# Patient Record
Sex: Male | Born: 1935 | Race: White | Hispanic: No | Marital: Single
Health system: Southern US, Community
[De-identification: ages and names within clinical notes are randomized; demographics above are authoritative.]

---

## 2012-10-19 ENCOUNTER — Inpatient Hospital Stay: Payer: Self-pay | Admitting: Internal Medicine

## 2012-10-19 LAB — URINALYSIS, COMPLETE
Blood: NEGATIVE
Glucose,UR: 50 mg/dL (ref 0–75)
Hyaline Cast: 3
Nitrite: NEGATIVE
Ph: 5 (ref 4.5–8.0)
Protein: NEGATIVE
RBC,UR: 1 /HPF (ref 0–5)
Specific Gravity: 1.02 (ref 1.003–1.030)
Squamous Epithelial: <1

## 2012-10-19 LAB — IRON AND TIBC
Iron Saturation: 6 %
Iron: 16 ug/dL — ABNORMAL LOW (ref 65–175)
Unbound Iron-Bind.Cap.: 240 ug/dL

## 2012-10-19 LAB — COMPREHENSIVE METABOLIC PANEL
Albumin: 2.5 g/dL — ABNORMAL LOW (ref 3.4–5.0)
Alkaline Phosphatase: 81 U/L (ref 50–136)
Anion Gap: 16 (ref 7–16)
BUN: 42 mg/dL — ABNORMAL HIGH (ref 7–18)
Bilirubin,Total: 1.1 mg/dL — ABNORMAL HIGH (ref 0.2–1.0)
Calcium, Total: 8.2 mg/dL — ABNORMAL LOW (ref 8.5–10.1)
Co2: 18 mmol/L — ABNORMAL LOW (ref 21–32)
Creatinine: 1.15 mg/dL (ref 0.60–1.30)
EGFR (African American): >60
EGFR (Non-African Amer.): >60
Glucose: 141 mg/dL — ABNORMAL HIGH (ref 65–99)
Osmolality: 275 (ref 275–301)
Potassium: 3.6 mmol/L (ref 3.5–5.1)
SGPT (ALT): 9 U/L — ABNORMAL LOW (ref 12–78)
Sodium: 131 mmol/L — ABNORMAL LOW (ref 136–145)

## 2012-10-19 LAB — CBC
HGB: 6.4 g/dL — ABNORMAL LOW (ref 13.0–18.0)
MCH: 28.1 pg (ref 26.0–34.0)
MCHC: 32.8 g/dL (ref 32.0–36.0)
MCV: 86 fL (ref 80–100)
RBC: 2.27 10*6/uL — ABNORMAL LOW (ref 4.40–5.90)

## 2012-10-20 LAB — CBC WITH DIFFERENTIAL/PLATELET
Basophil #: 0 10*3/uL (ref 0.0–0.1)
Basophil %: 0.1 %
Eosinophil #: 0 10*3/uL (ref 0.0–0.7)
HGB: 7.3 g/dL — ABNORMAL LOW (ref 13.0–18.0)
Lymphocyte %: 7.9 %
MCHC: 32.5 g/dL (ref 32.0–36.0)
MCV: 86 fL (ref 80–100)
Neutrophil %: 86.6 %
Platelet: 328 10*3/uL (ref 150–440)
RDW: 15.2 % — ABNORMAL HIGH (ref 11.5–14.5)

## 2012-10-20 LAB — COMPREHENSIVE METABOLIC PANEL
Albumin: 1.9 g/dL — ABNORMAL LOW (ref 3.4–5.0)
Alkaline Phosphatase: 75 U/L (ref 50–136)
Anion Gap: 7 (ref 7–16)
Bilirubin,Total: 1.4 mg/dL — ABNORMAL HIGH (ref 0.2–1.0)
Calcium, Total: 7.4 mg/dL — ABNORMAL LOW (ref 8.5–10.1)
Co2: 23 mmol/L (ref 21–32)
Creatinine: 0.59 mg/dL — ABNORMAL LOW (ref 0.60–1.30)
Glucose: 88 mg/dL (ref 65–99)
Osmolality: 268 (ref 275–301)
Potassium: 3.8 mmol/L (ref 3.5–5.1)
Sodium: 132 mmol/L — ABNORMAL LOW (ref 136–145)
Total Protein: 5.3 g/dL — ABNORMAL LOW (ref 6.4–8.2)

## 2012-10-20 LAB — OCCULT BLOOD X 1 CARD TO LAB, STOOL: Occult Blood, Feces: POSITIVE

## 2012-10-21 LAB — CBC WITH DIFFERENTIAL/PLATELET
Eosinophil #: 0 10*3/uL (ref 0.0–0.7)
HCT: 27.3 % — ABNORMAL LOW (ref 40.0–52.0)
Lymphocyte #: 1.1 10*3/uL (ref 1.0–3.6)
MCH: 29.2 pg (ref 26.0–34.0)
MCHC: 33.2 g/dL (ref 32.0–36.0)
MCV: 88 fL (ref 80–100)
Monocyte %: 5.8 %
Neutrophil #: 8.9 10*3/uL — ABNORMAL HIGH (ref 1.4–6.5)
Neutrophil %: 83.5 %
RDW: 14.9 % — ABNORMAL HIGH (ref 11.5–14.5)

## 2012-10-22 LAB — AMMONIA: Ammonia, Plasma: <25 mcmol/L (ref 11–32)

## 2012-10-23 LAB — CBC WITH DIFFERENTIAL/PLATELET
Basophil #: 0 10*3/uL (ref 0.0–0.1)
Eosinophil #: 0.1 10*3/uL (ref 0.0–0.7)
HCT: 29 % — ABNORMAL LOW (ref 40.0–52.0)
HGB: 9.6 g/dL — ABNORMAL LOW (ref 13.0–18.0)
Lymphocyte #: 1.5 10*3/uL (ref 1.0–3.6)
Lymphocyte %: 18.2 %
MCH: 29 pg (ref 26.0–34.0)
MCV: 87 fL (ref 80–100)
Monocyte #: 0.6 x10 3/mm (ref 0.2–1.0)
Neutrophil #: 6 10*3/uL (ref 1.4–6.5)
Neutrophil %: 72.9 %
Platelet: 337 10*3/uL (ref 150–440)
RBC: 3.32 10*6/uL — ABNORMAL LOW (ref 4.40–5.90)
RDW: 15.4 % — ABNORMAL HIGH (ref 11.5–14.5)
WBC: 8.2 10*3/uL (ref 3.8–10.6)

## 2012-10-23 LAB — BASIC METABOLIC PANEL
Anion Gap: 7 (ref 7–16)
Calcium, Total: 7.7 mg/dL — ABNORMAL LOW (ref 8.5–10.1)
Chloride: 104 mmol/L (ref 98–107)
Co2: 27 mmol/L (ref 21–32)
Creatinine: 0.54 mg/dL — ABNORMAL LOW (ref 0.60–1.30)
EGFR (African American): >60
EGFR (Non-African Amer.): >60
Potassium: 3.9 mmol/L (ref 3.5–5.1)

## 2012-10-24 LAB — CBC WITH DIFFERENTIAL/PLATELET
Basophil %: 0.7 %
Eosinophil #: 0.1 10*3/uL (ref 0.0–0.7)
HCT: 26.9 % — ABNORMAL LOW (ref 40.0–52.0)
Lymphocyte %: 22.7 %
MCH: 29.5 pg (ref 26.0–34.0)
MCHC: 33.9 g/dL (ref 32.0–36.0)
MCV: 87 fL (ref 80–100)
Monocyte #: 0.5 x10 3/mm (ref 0.2–1.0)
Monocyte %: 8.3 %
Neutrophil #: 4.1 10*3/uL (ref 1.4–6.5)
Neutrophil %: 66 %
Platelet: 308 10*3/uL (ref 150–440)
RBC: 3.08 10*6/uL — ABNORMAL LOW (ref 4.40–5.90)

## 2013-01-17 DEATH — deceased

## 2013-06-10 IMAGING — CT CT ABDOMEN W/ CM
2 of 3 series · 13 of 32 positions shown, 19 images · IV contrast (isovue)
Comparison: None

REASON FOR EXAM: weight loss, malignant looking ulcer;    NOTE: Nursing
to Give Oral CT Contrast
COMMENTS:

PROCEDURE:     CT  - CT ABDOMEN STANDARD W  - October 24, 2012  [DATE]
RESULT:     History: Weight loss
TECHNIQUE: Multiple axial images of the abdomen were performed from the lung
bases to the iliac crests, with p.o. contrast and with 75 ml of Isovue 300
intravenous contrast.

[Series 2: 3mm soft tissue · axial · 0.68mm/px · z∈[-788,-548]mm · 11 of 97 slices shown, 17 images]
[im 9/97  soft-tissue]
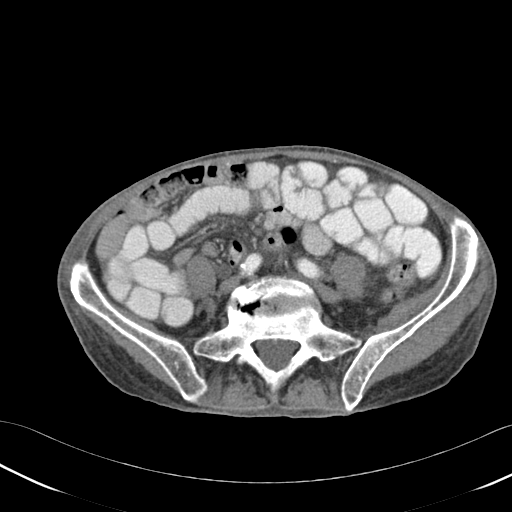
[im 9/97  bone]
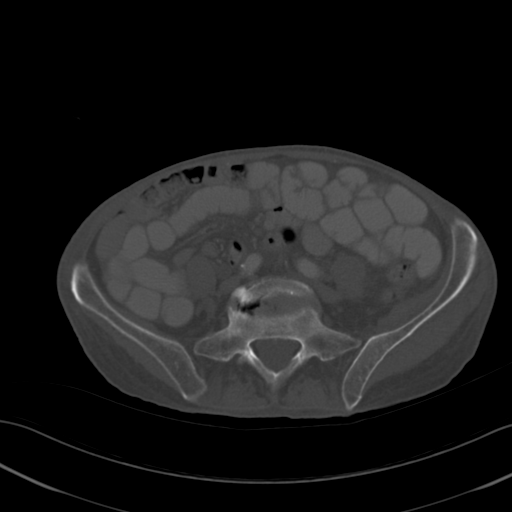
[im 17/97  soft-tissue]
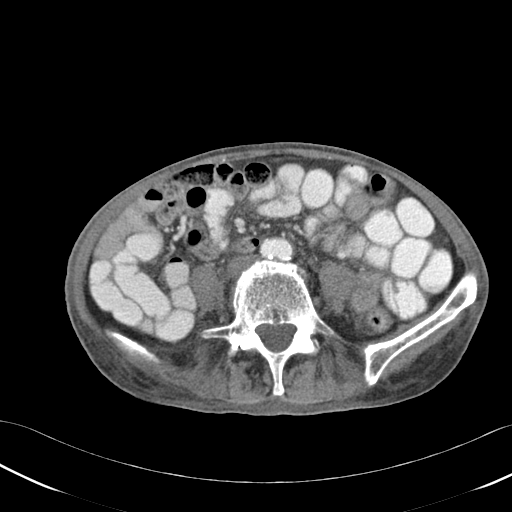
[im 25/97  soft-tissue]
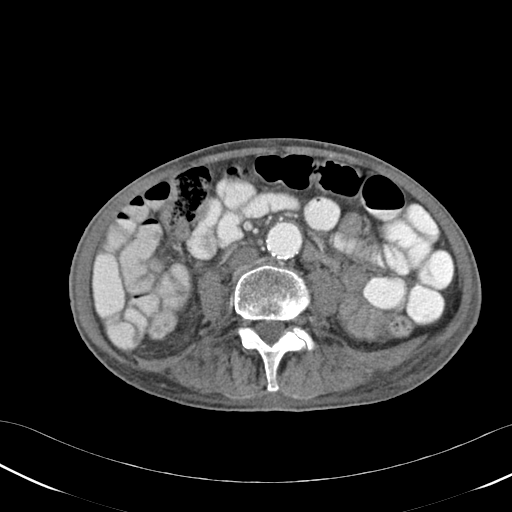
[im 33/97  soft-tissue]
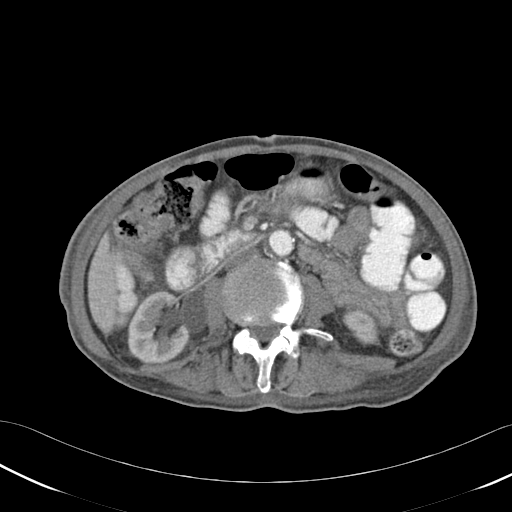
[im 41/97  soft-tissue]
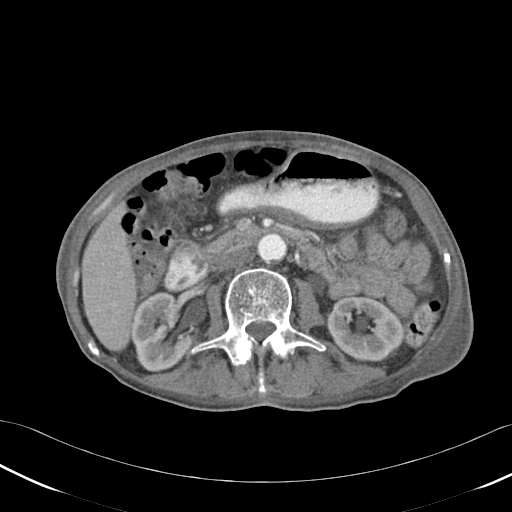
[im 49/97  soft-tissue]
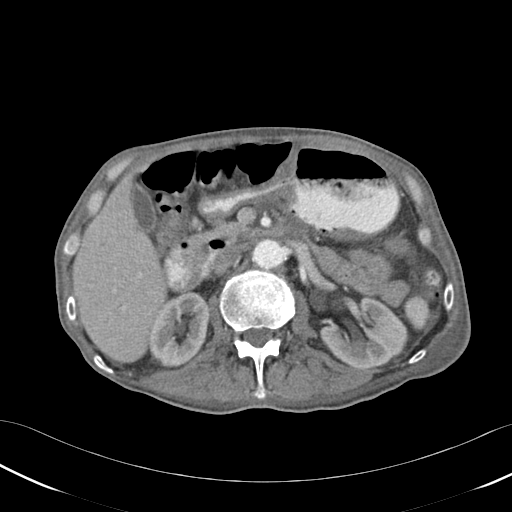
[im 57/97  soft-tissue]
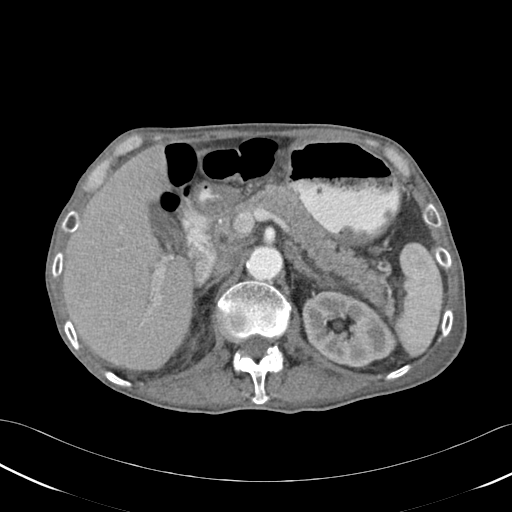
[im 65/97  soft-tissue]
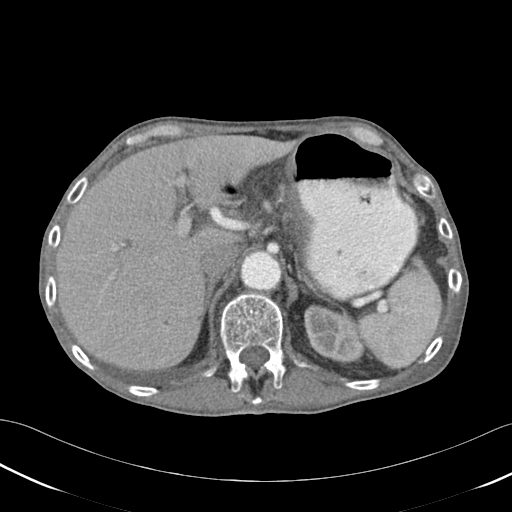
[im 65/97  lung]
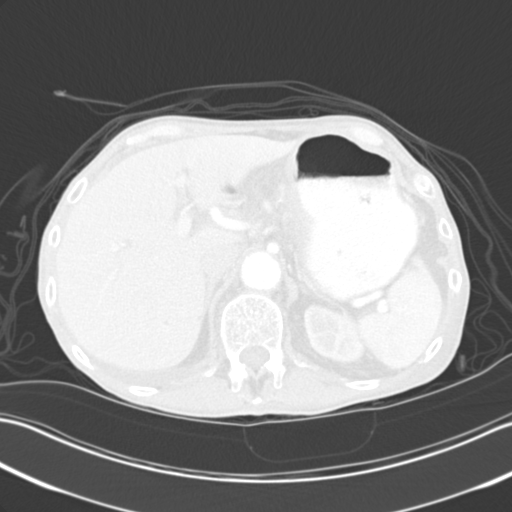
[im 73/97  soft-tissue]
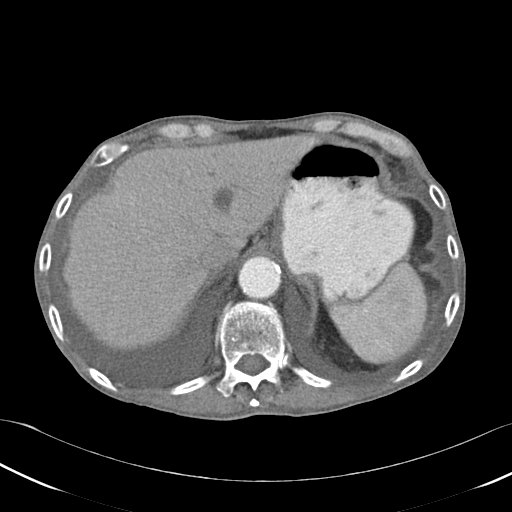
[im 73/97  lung]
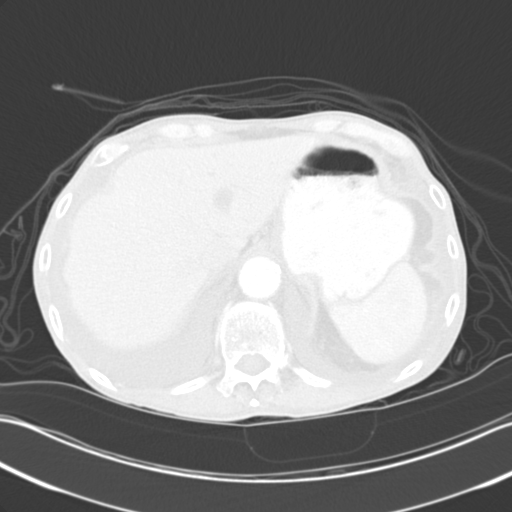
[im 73/97  bone]
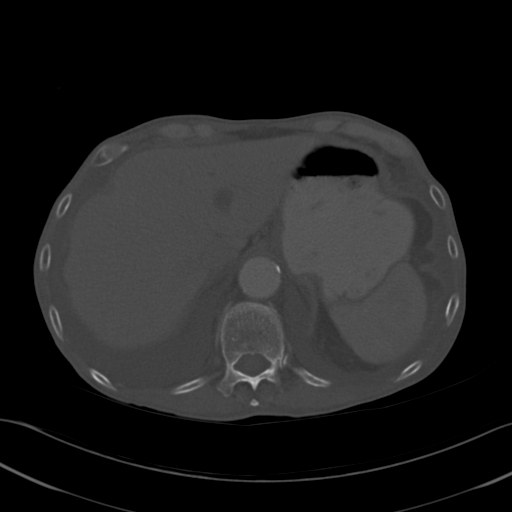
[im 81/97  soft-tissue]
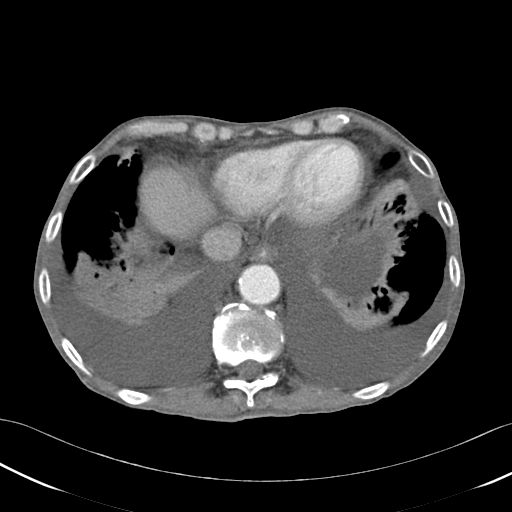
[im 81/97  lung]
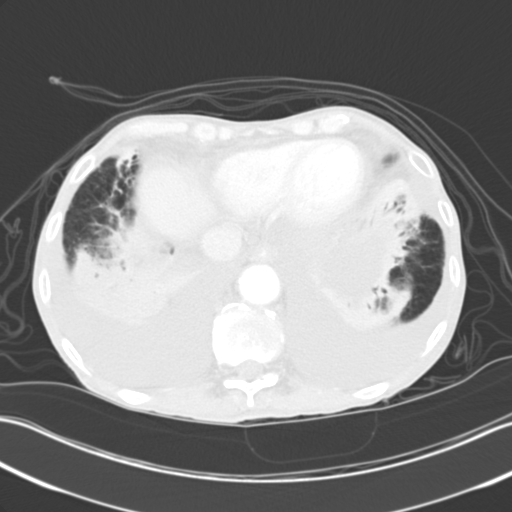
[im 89/97  soft-tissue]
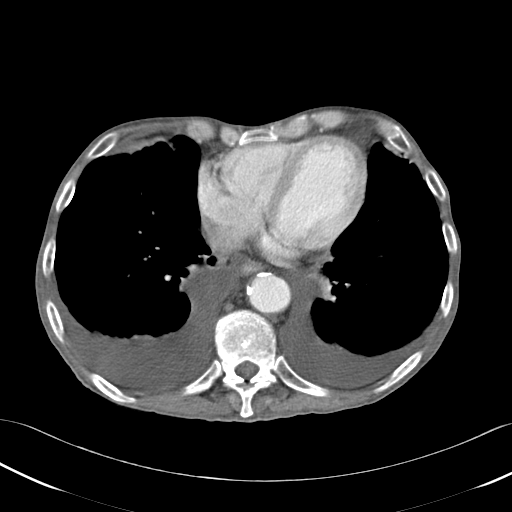
[im 89/97  lung]
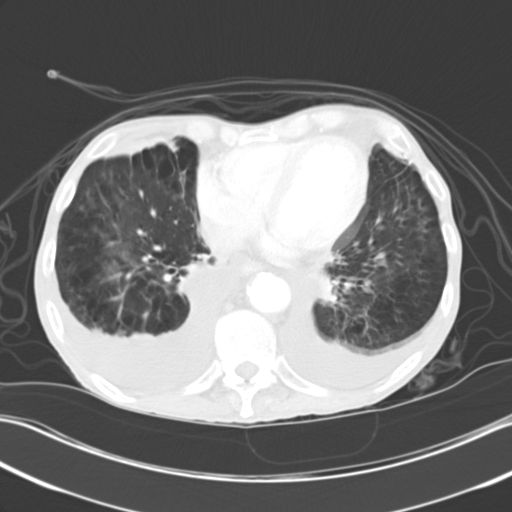

[Series 4: lung windows · axial · 0.68mm/px · z∈[-632,-604]mm · 2 of 45 slices shown]
[im 9/45  bone]
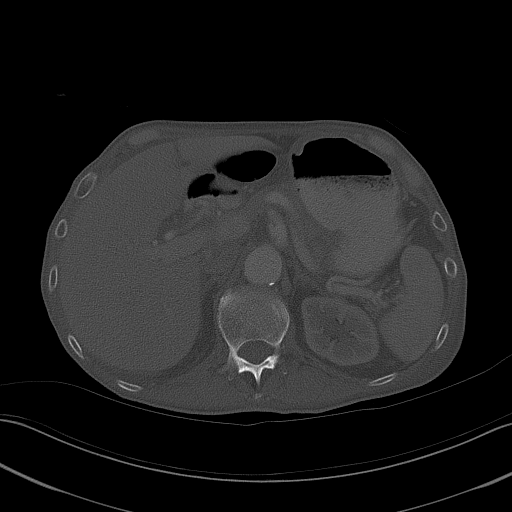
[im 18/45  bone]
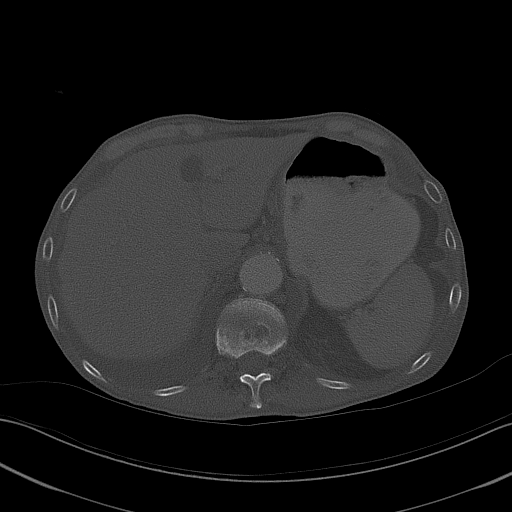

[13 of 32 positions shown; findings below may reference images not displayed]

FINDINGS: There bilateral small pleural effusions, right greater than left. There is
bibasilar scarring.

There are 2 hypodense, nonenhancing fluid attenuating hepatic masses likely
representing cysts. There is no intrahepatic or extrahepatic biliary ductal
dilatation. The gallbladder is unremarkable. The spleen demonstrates no
focal abnormality. The kidneys, adrenal glands, and pancreas are normal.

There is mild relative bowel wall thickening involving the gastric antrum.
The visualized portions of the duodenum, small intestine, and large
intestine demonstrate no contrast extravasation or dilatation. There is no
pneumoperitoneum, pneumatosis, or portal venous gas. There is no abdominal
free fluid. There is no lymphadenopathy. There is a partially visualized
cm cystic left anterior abdominal mass.

The abdominal aorta is normal in caliber with atherosclerosis.

The osseous structures are unremarkable.
IMPRESSION: 1. There is mild relative bowel wall thickening involving the gastric
antrum. Correlate with upper endoscopy.

2.  There is a partially visualized 5.8 cm cystic left anterior abdominal
mass.

3. The bilateral small pleural effusions.

[REDACTED]

## 2015-04-05 NOTE — Consult Note (Signed)
CC: large gastric ulcer.  Pt denies current abd pain, no vomiting, no further bleeding.  Abd exam shows no mass, non tender.  Pt getting a CT today in case the ulcer is malignant.  VSS, afeb, WBC 6.2, hgb 9.1, plt  308.  No complaints except eager to get CT done,  feels bloated from prep.  Dr. Niel HummerIftikhar to cover this weekend and will see the patient.  Electronic Signatures: Scot JunElliott, Robert T (MD)  (Signed on 08-Nov-13 13:01)  Authored  Last Updated: 08-Nov-13 13:01 by Scot JunElliott, Robert T (MD)

## 2015-04-05 NOTE — Consult Note (Signed)
Brief Consult Note: Diagnosis: Normal grief, cognitive disorder NOS.   Patient was seen by consultant.   Consult note dictated.   Recommend further assessment or treatment.   Orders entered.   Discussed with Attending MD.   Comments: [name] has no psychiatric history except for some cognitive decline. He was admitted for failure to thrive after his wife passed away a month or so ago. The patient has extremely poor living conditions. He has not eaten for 5 days prior to admission. He lost a lot of weight. APS are involved.   He was scheduled for gastroscopy today but refused consent. He was reportedly unable/unwilling to cvommunicate. I saw the patient at 13:30. He was sitting in bed, adequately groomed, eating his luch. I had a sensible conversation with him. He is still uncertain if his wife is alive. He seemed to understand the need for further testing and promissed to "think about it".   MSE: Alert, oriented to person, place and somewhat the situation. He is pleasant, polite and cooperative. No SI, HI. He is somewhat delusional but this to be expected after a major loss. No hallucinations.   PLAN: 1. We will continue Remeron for depression/anxiety/sleep and appetite. Dr. Dava NajjarPanwar was concerned that Remeron made him so much worse today. I do not believe this be the case but Remeron, given his overall health situation, is nonesential and could be easily discontinued.  2. I will folow along.  Electronic Signatures: Kristine LineaPucilowska, Jolanta (MD)  (Signed [phone]-Nov-13 18:17)  Authored: Brief Consult Note   Last Updated: 05-Nov-13 18:17 by Kristine LineaPucilowska, Jolanta (MD)

## 2015-04-05 NOTE — Consult Note (Signed)
PATIENT NAME:  Carl DolphinDAVIS, Vasilios G MR#:  [id number] DATE OF BIRTH:  [birth year]  DATE OF CONSULTATION:  10/19/2012  REFERRING PHYSICIAN:   Aram BeechamJeffrey Sparks, MD CONSULTING PHYSICIAN:  Scot Junobert T. Letonia Stead, MD  CHIEF COMPLAINT: Profound anemia.   HISTORY OF PRESENT ILLNESS: The patient is a 79 year old white male whose wife apparently died a month ago. He was found by neighbors to be in his home without electricity or power for five days and said he had eaten in five days. His home was covered in filth and feces.  He was brought to the ER found to be extremely anemic, somewhat dehydrated, and was admitted to the hospital. I was asked to see him in consultation.   HABITS: The patient smokes a pack a day. Never been a drinker.   PAST MEDICAL HISTORY: He has never been in the hospital, no prior surgery.  REVIEW OF SYSTEMS: Denies headaches. No shortness of breath. No wheezing. No coughing. No chest pains. No dysphagia. No nausea or vomiting. No diarrhea or constipation. No asthma, wheezing, emphysema, chronic cough, or shortness of breath. No blood in his stool no black bowel movements. He has lost about 15 pounds in an undetermined length of time. He has not been to see a doctor in 24 years.   ALLERGIES: No known drug allergies.   MEDICATIONS:  1. Aspirin 81 mg a day.  2. Cream for psoriasis.   FAMILY HISTORY: Positive for cancer in his sister, etiology unknown.   PHYSICAL EXAMINATION:  GENERAL: Thin white male with a beard, muscle wasting, emaciated, in no acute distress.   VITAL SIGNS: On admission blood pressure 97/46, heart rate 64, respirations 18, temperature 96.   HEENT: Sclerae anicteric. Conjunctivae negative. Tongue negative.   NECK: No thyromegaly.   CHEST: Clear anterior fields.   HEART: No murmurs or gallops I can hear.   ABDOMEN: Flat, nontender. No hepatosplenomegaly, no masses, no bruits.  No significant tenderness.   RECTAL: Exam done by ER physician was heme positive.    EXTREMITIES: Muscle wasting. No cyanosis or edema.  PSYCH: He was oriented to person, place, and time, answered questions appropriately except he was unsure how long his wife had been dead.  LABORATORY DATA: White count 13.9, hemoglobin 6.4, MCV 86, glucose 141, BUN 42, creatinine 1.15. Bilirubin and other liver functions normal.   ASSESSMENT: Heme-positive stool with anemia in a patient using aspirin with weight loss, possible gastric ulcer, duodenal ulcer, possible asymptomatic colonic malignancy.   PLAN: Agree with your plan for transfusions and vitamin supplements and IV Protonix. After a couple of days of improvement I would like to take a look in his stomach and then possibly a colonoscopy as well. We will follow with you.   ____________________________ Scot Junobert T. Ailed Defibaugh, MD rte:bjt D: 10/19/2012 17:04:03 ET T: 10/20/2012 05:45:45 ET JOB#: [id number]  cc: Scot Junobert T. Rainn Bullinger, MD, <Dictator> Scot JunOBERT T Cha Gomillion MD ELECTRONICALLY SIGNED 10/29/2012 17:20

## 2015-04-05 NOTE — Consult Note (Signed)
Brief Consult Note: Diagnosis: Normal grief, cognitive disorder NOS.   Patient was seen by consultant.   Consult note dictated.   Recommend further assessment or treatment.   Orders entered.   Comments: Carl Collins has no psychiatric history except for some cognitive decline. He was admitted for failure to thrive after his wife passed away a month or so ago. The patient has extremely poor living conditions. He has not eaten for 5 days prior to admission. He lost a lot of weight. APS are involved.   PLAN: 1. Will start Remeron for depression/anxiety/sleep and appetite.   2. I will folow along.  Electronic Signatures: Kristine LineaPucilowska, Rajah Tagliaferro (MD)  (Signed [phone]-Nov-13 14:28)  Authored: Brief Consult Note   Last Updated: 04-Nov-13 14:28 by Kristine LineaPucilowska, Carl Collins (MD)

## 2015-04-05 NOTE — Consult Note (Signed)
PATIENT NAME:  Carl Collins, EDMONDSON MR#:  [id number] DATE OF BIRTH:  1936-07-24  DATE OF CONSULTATION:  10/20/2012  REFERRING PHYSICIAN: Aram Beecham, MD  CONSULTING PHYSICIAN: Denessa Cavan B. Antwonette Feliz A M.D.   REASON FOR CONSULTATION: To evaluate a depressed patient.   IDENTIFYING DATA: [name] is a 79 year old male with no past psychiatric history.   CHIEF COMPLAINT: "I don't know where my wife is."  HISTORY OF PRESENT ILLNESS: Mr. Pop was brought to the hospital  after he was found  at his house with no food, electricity or any care for five days prior to admission. Apparently, his wife died  one month ago, the couple was under investigation of adult protective services at that time when she was still alive. After she passed away the case was closed. It was reopened now when it turns out that the patient is unable to care for himself.  He was admitted to the hospital extremely anemic and dehydrated complaining of abdominal pain. The patient has no complaints except that he wonders where his wife could be. On the one hand he tells me that he has her death certificate but is certain that she is not buried because she wanted to be buried with him. He is completely uncertain what happened to her. He denies any symptoms of depression, anxiety, or psychosis. He does admit to some weight loss and tells me that his good weight was between 145 and 152 pounds but lately he was closer to  130. His weight on admission was 91 pounds. The patient is not able to provide any additional information. He tells me that he has children, but does not know their number. I will try to obtain collateral data from the family but unfortunately no one is listed in our system. I spoke with care manager and following Adult Protective Services investigation, the plan is to place this patient in rehab.   PAST PSYCHIATRIC HISTORY: None reported.   FAMILY PSYCHIATRIC HISTORY: Unknown.   PAST MEDICAL HISTORY: Psoriasis.    MEDICATIONS ON ADMISSION: Aspirin 81 mg daily. Cream for  psoriasis. At the same time there is  information in the chart that the patient has not seen at doctor in 24 years, indeed this is his first a visit to our hospital ever.  SOCIAL HISTORY: As above. He is now widowed. He lives by himself in [place] home. The place is dilapidated. The patient has not been able to care for himself. His bills are unpaid even though reportedly there is money in the bank. The patient shows some cognitive decline possibly he is overwhelmed with grief that would make it extremely difficult for him to make it by himself. The patient claims he has children, but I have no independent information about it.  REVIEW OF SYSTEMS: The patient denies any pain or discomfort, except for abdominal pain. He reports there is some weight loss. Review of systems otherwise negative.   PHYSICAL EXAMINATION:  VITAL SIGNS: Blood pressure 93/55, pulse 63, respirations 18, temperature 97.6.   GENERAL: This is a cachectic male in no acute distress. The rest of the physical examination is deferred to his primary attending.   LABORATORY DATA: Chemistries are within normal limits except for elevated BUN 25 and low sodium of 132. Blood alcohol level on admission was not taken. LFTs, total protein 5.3, albumin 1.9. Total bilirubin 1.4, alkaline phosphatase 75, AST 10. ALT seven. CBC: white blood count of 12.1, red blood cells 2.58, hemoglobin 7.3, up from 6.4 on  admission, hematocrit 22, platelets 328. Reticulocyte count 5.4. Urinalysis is not suggestive of urinary tract infection.   EKG: Normal sinus rhythm, normal EKG.   MENTAL STATUS EXAMINATION: The patient is alert and oriented to person and place. He does not even [facility] but he does realize that he is in the hospital. He does not and now the reason for his hospitalization. He is pleasant and polite. He maintains good eye contact. His speech is soft.  Mood is depressed with flat affect. Thought processing is slow. Thought content: He denies suicidal or homicidal ideation. He does not appear to be psychotic,  just disorganized. His cognition is impaired  but the patient does not do to participate in cognitive part of the exam. His insight and judgment are extremely poor.         Suicide risk assessment ON admission:   dictation ended here.  ____________________________ Ellin GoodieJolanta B. Jennet MaduroPucilowska, MD jbp:ljs D: 10/20/2012 15:19:16 ET T: 10/20/2012 16:15:20 ET JOB#: [id number]  cc: Kjersten Ormiston B. Jennet MaduroPucilowska, MD, <Dictator> Shari ProwsJOLANTA B Fabiana Dromgoole MD ELECTRONICALLY SIGNED 10/23/2012 17:33

## 2015-04-05 NOTE — Discharge Summary (Signed)
PATIENT NAME:  Carl Collins, Carl Collins MR#:  [id number] DATE OF BIRTH:  [birth year]  DATE OF ADMISSION:  10/19/2012 DATE OF DISCHARGE:  10/25/2012   PRIMARY CARE PHYSICIAN: None.   PRESENTING COMPLAINT: Failure to thrive, profound anemia, guaiac-positive stools, and dehydration.   DISCHARGE DIAGNOSES: 1. Large gastric ulcer with iron deficiency anemia status post 3 unit blood transfusion, status post endoscopy.  2. H. pylori. The patient was started on treatment. CT abdomen suggestive of 5.8 cm lower abdominal cystic mass. Work-up as outpatient at a later date for follow-up CT.  3. Hyperglycemia, resolved.  4. Failure to thrive, dehydration, generalized weakness, improving.  5. Hyponatremia due to dehydration and poor p.o. intake. The patient appears euvolemic.  6. Possible depression, started on Remeron.   CONSULTATIONS:  1. GI consultation with Dr. Vira Agar  2. Psych consultation with Dr. Bary Leriche    DISPOSITION: The patient is being discharged to [facility] for rehab.   LABORATORY, DIAGNOSTIC, AND RADIOLOGICAL DATA: CT of the abdomen with contrast shows mild relative bowel thickening involving the gastric antrum. There is partially visualized 5.8 cm cystic left anterior abdominal mass. Small bilateral pleural effusions.   White count 6.2, hemoglobin and hematocrit 9.1 and 26.9, platelet count 308. Basic metabolic panel within normal limits.   Path results for the gastric ulcer shows antral mucosa with erosive gastritis, ulceration, and intestinal metaplasia. Helicobacter organisms are identified. Negative for dysplasia and malignancy.   Ammonia level 25. ESR is 28. C-reactive protein is 48. Guaiac-positive. Urinalysis negative for UTI. Hemoglobin and hematocrit on admission was 6.4 and 19.4.   BRIEF SUMMARY OF HOSPITAL COURSE: Carl Collins is a 79 year old Caucasian gentleman with past medical history of chronic abdominal pain and psoriasis who came when he was found by his  neighbors without power and electricity for approximately five days. He was found extremely weak and brought to the Emergency Room with:  1. Hyponatremia, significant dehydration, and generalized weakness. The patient was started on IV fluids. His electrolytes were repleted. The patient is currently euvolemic, eating well.  2. Profound anemia with guaiac positive stools. The patient was given 3 units of blood transfusion. His hemoglobin is 9.1. He was seen by Dr. Vira Agar, underwent endoscopy which showed large ulcer. The patient was on PPI during the hospital stay. Will continue PPI for now and be treated for H. pylori gastritis for two weeks. The patient is prescribed omeprazole, clarithromycin, and amoxicillin.  3. Failure to thrive, generalized weakness, and dehydration, improved. Physical therapy saw the patient and recommends rehab. The patient will be discharged to [facility].  4. Possible depression with recent loss of wife about a month ago. The patient was found in his house in unlivable condition. Dr. Bary Leriche recommends Remeron. The patient is tolerating it.  5. The patient will follow-up with Dr. Vira Agar as outpatient in three weeks and will need repeat endoscopy at a later date to ensure healing malignant ulcer. He can also be followed by GI regarding his left anterior abdominal cystic mass as outpatient.   Hospital stay otherwise remained stable.   CODE STATUS: The patient remains a FULL CODE.   TIME SPENT: 40 minutes.   ____________________________ Hart Rochester Posey Pronto, MD sap:drc D: 10/25/2012 11:26:28 ET T: 10/25/2012 12:00:29 ET JOB#: [id number]  cc: Oshea Percival A. Posey Pronto, MD, <Dictator>, Manya Silvas, MD, [place] Bary Leriche, MD Ilda Basset MD ELECTRONICALLY SIGNED 10/28/2012 1:25

## 2015-04-05 NOTE — Consult Note (Signed)
Chief Complaint:   Subjective/Chief Complaint Feels better. No vomiting. Tolerating diet well.   VITAL SIGNS/ANCILLARY NOTES: **Vital Signs.:   09-Nov-13 05:43   Vital Signs Type Routine   Temperature Temperature (F) 98.5   Celsius 36.9   Temperature Source Oral   Pulse Pulse 82   Respirations Respirations 18   Systolic BP Systolic BP 97   Diastolic BP (mmHg) Diastolic BP (mmHg) 59   Mean BP 71   Pulse Ox % Pulse Ox % 96   Pulse Ox Activity Level  At rest   Oxygen Delivery Room Air/ 21 %   Brief Assessment:   Additional Physical Exam Abdomen is flat. Mild epigastric tenderness.   Lab Results: Routine Hem:  08-Nov-13 05:48    WBC (CBC) 6.2   RBC (CBC)  3.08   Hemoglobin (CBC)  9.1   Hematocrit (CBC)  26.9   Platelet Count (CBC) 308   MCV 87   MCH 29.5   MCHC 33.9   RDW  15.0   Neutrophil % 66.0   Lymphocyte % 22.7   Monocyte % 8.3   Eosinophil % 2.3   Basophil % 0.7   Neutrophil # 4.1   Lymphocyte # 1.4   Monocyte # 0.5   Eosinophil # 0.1   Basophil # 0.0 (Result(s) reported on 24 Oct 2012 at 06:19AM.)   Assessment/Plan:  Assessment/Plan:   Assessment Gastric ulcer. Patient is fairly asymptomatic.    Plan Continue PPI as OP and treat for H. pylori as well as OP. OP follow up with Dr. Mechele CollinElliott in 4-6 weeks for consideration for repeat EGD to document healing.   Electronic Signatures: Lurline DelIftikhar, Juli Odom (MD)  (Signed [phone]-Nov-13 11:12)  Authored: Chief Complaint, VITAL SIGNS/ANCILLARY NOTES, Brief Assessment, Lab Results, Assessment/Plan   Last Updated: 09-Nov-13 11:12 by Lurline DelIftikhar, Romi Rathel (MD)

## 2015-04-05 NOTE — Consult Note (Signed)
CC: large gastric ulcer.  BX showed no malignancy, positive Helicobacter, will wait until some healing occurs before treating as Biaxin is often poorly tolerated in ulcerated stomachs.  Electronic Signatures: Scot JunElliott, Noeh Sparacino T (MD)  (Signed on 08-Nov-13 16:06)  Authored  Last Updated: 08-Nov-13 16:06 by Scot JunElliott, Hameed Kolar T (MD)

## 2015-04-05 NOTE — Consult Note (Signed)
CC GI bleed.  Pt EGD showed a very large gastic ulcer in body of stomach without active bleeding or clots.  Bx done.  Need CT of abdomen tomorrow. Will start clear liq and advance as tol to full.  Electronic Signatures: Scot JunElliott, Niyonna Betsill T (MD)  (Signed on 07-Nov-13 13:28)  Authored  Last Updated: 07-Nov-13 13:28 by Scot JunElliott, Winnie Umali T (MD)

## 2015-04-05 NOTE — Consult Note (Signed)
Brief Consult Note: Diagnosis: Normal grief, cognitive disorder NOS.   Patient was seen by consultant.   Consult note dictated.   Recommend further assessment or treatment.   Orders entered.   Comments: [name] has no psychiatric history except for some cognitive decline. He was admitted with sever anemia and failure to thrive. He was diagnosed with gastic ulcer.   MSE: [name] had amaizing turnaround. he is pleasant, interactive and sensible. There are no safety issues. He participates in his treatment.    PLAN: 1. Please continue Remeron for depression/anxiety/sleep and appetite.   2. I will folow along.  Electronic Signatures: Kristine LineaPucilowska, Valari Taylor (MD)  (Signed [phone]-Nov-13 21:19)  Authored: Brief Consult Note   Last Updated: 08-Nov-13 21:19 by Kristine LineaPucilowska, Charrisse Masley (MD)

## 2015-04-05 NOTE — Consult Note (Signed)
CC: GI bleed.  Pt had copious amts of black stool yesterday evening, admitted with severe anemia and hypotension.  Hgb up to 9 after 3 units of blood.  Pt with psychiatry/neurology issues.  Consider CT scan of head, neuro consult, alcohol withdrawal, serum NH3 level, sed rate, CRP. CT of chest.  Pt withdrawn, head under sheet not responding to questions.  Electronic Signatures: Scot JunElliott, Harm Jou T (MD)  (Signed on 05-Nov-13 17:01)  Authored  Last Updated: 05-Nov-13 17:01 by Scot JunElliott, Estephany Perot T (MD)

## 2015-04-05 NOTE — H&P (Signed)
PATIENT NAME:  Carl DolphinDAVIS, Carl G MR#:  [id number] DATE OF BIRTH:  [birth year]  DATE OF ADMISSION:  10/19/2012  REFERRING PHYSICIAN: Dr. Brien MatesBraud, Emergency Room.   FAMILY PHYSICIAN: Unknown.   REASON FOR ADMISSION: Failure to thrive with profound anemia, guaiac positive stools, and dehydration.   HISTORY OF PRESENT ILLNESS: The patient is a 79 year old male with a history of psoriasis. The patient says he has chronic abdominal pain for which he takes aspirin therapy. Apparently, his wife died approximately one month ago. EMS was called out to his house today by neighbors after his home had been without power or electricity for approximately five days. The patient was found by EMS extremely weak, having to crawl to the door. His home was covered with filth and feces. He was brought to the Emergency Room where he was found to be dehydrated and extremely anemic. He is now admitted for further evaluation.   PAST MEDICAL HISTORY:  1. Chronic abdominal pain.  2. Psoriasis.   MEDICATIONS:  1. Aspirin 81 mg p.o. daily.  2. Psoriasis medicine, name and dose unknown.   ALLERGIES: No known drug allergies.   SOCIAL HISTORY: The patient is widowed. He denies alcohol or tobacco abuse.   FAMILY HISTORY: Positive for high blood pressure but otherwise unremarkable per the patient.   REVIEW OF SYSTEMS: CONSTITUTIONAL: No fever although he does admit to some weight loss as he has not eaten in five days. EYES: No blurred or double vision. No glaucoma. ENT: No tinnitus or hearing loss. No nasal discharge or bleeding. No difficulty swallowing. RESPIRATORY: No cough or wheezing. Denies painful respiration. CARDIOVASCULAR: No chest pain or orthopnea. No palpitations or syncope. GASTROINTESTINAL: No nausea, vomiting, or diarrhea. Does complain of lower abdominal pain. No change in bowel habits. GU: No dysuria or hematuria. No incontinence. ENDOCRINE: No polyuria or polydipsia. No heat or cold intolerance. HEMATOLOGIC: The  patient denies anemia, easy bruising, or bleeding. LYMPHATIC: No swollen glands. MUSCULOSKELETAL: The patient denies pain in his neck, back, shoulders, knees, or hips. No gout. NEUROLOGIC: No numbness. Denies migraines, stroke or seizures. PSYCH: The patient denies anxiety, insomnia, or depression.   PHYSICAL EXAMINATION:  GENERAL: The patient is chronically ill-appearing, disheveled and malnourished, but in no acute distress.   VITAL SIGNS: Vital signs are currently remarkable for a blood pressure of 97/46 with a heart rate of 65, respiratory rate of 18. Temperature is 95.9.   HEENT: Normocephalic, atraumatic. Pupils equally round and reactive to light and accommodation. Extraocular movements are intact. Sclerae anicteric. Conjunctivae are clear. Oropharynx is dry but clear.   NECK: Supple without jugular venous distention. No adenopathy or thyromegaly is noted.   LUNGS: Lungs revealed scattered rhonchi without wheezes or rales. No dullness.   CARDIAC: Regular rate and rhythm. Normal S1 and S2. No significant rubs, murmurs, or gallops. PMI is nondisplaced. Chest wall is nontender.   ABDOMEN: Soft, nontender, with normoactive bowel sounds. No organomegaly or masses were appreciated. No hernias or bruits were noted.   RECTAL: Guaiac positive stool per the Emergency Room physician.   EXTREMITIES: Without clubbing, cyanosis, cyanosis, or edema. Pulses were 1+ bilaterally.   SKIN: Warm and dry without rash or lesions.   NEUROLOGIC: Cranial nerves II through XII grossly intact. Deep tendon reflexes were symmetric. Motor and sensory examination is nonfocal.   PSYCH: The patient was alert and oriented to person, place, and time. Affect was unremarkable.   LABORATORY DATA: EKG revealed sinus rhythm with no acute ischemic changes. White count  was 13.9 with a hemoglobin of 6.4 and a MCV of 86. Glucose was 141 with a BUN of 42 and a creatinine of 1.15 with a GFR of greater than 60. Bilirubin was 1.1  with an AST of 15 and an ALT of 9.   ASSESSMENT:  1. Profound normocytic anemia of unclear etiology.  2. Failure to thrive.  3. Dehydration.  4. Generalized weakness.  5. Hyponatremia.  6. History of psoriasis.  7. Chronic abdominal pain.  8. Guaiac positive stools.   PLAN:  1. The patient will be admitted to the floor with IV fluids.  2. We will transfuse 2 units of packed red blood cells at this time.  3. We will send off anemia labs and guaiac all stools.  4. Will obtain a gastroenterology consult because of his chronic abdominal pain and anemia.  5. Begin IV Protonix.  6. We will begin thiamine, folic acid, and a multivitamin.  7. We will consult physical therapy and speech therapy for evaluation.  8. We will also consult a care manager for placement as the patient is unable to care for himself. 9. We will obtain a baseline chest x-ray.  10. Further treatment and evaluation will depend upon the patient's progress.   TOTAL TIME SPENT ON THIS PATIENT: 50 minutes.   ____________________________ Duane Lope Judithann Sheen, MD jds:ap D: 10/19/2012 13:13:52 ET T: 10/19/2012 13:36:11 ET JOB#: [id number]  cc: Duane Lope. Judithann Sheen, MD, <Dictator> Sharmila Wrobleski Rodena Medin MD ELECTRONICALLY SIGNED 10/19/2012 16:42

## 2015-04-05 NOTE — Consult Note (Signed)
CC:  GI bleeding, severe anemia, Pt eating better, BP better, no further melena.  He seems more with it today.  discussed EGD for tomorrow to see what caused his bleeding.  he is in agreement at this time.  Will schedule this for tomorrow. Chest clear.  Electronic Signatures: Scot JunElliott, Robert T (MD)  (Signed on 06-Nov-13 17:32)  Authored  Last Updated: 06-Nov-13 17:32 by Scot JunElliott, Robert T (MD)

## 2015-04-05 NOTE — Consult Note (Signed)
PATIENT NAME:  Carl Collins, Carl G MR#:  [id number] DATE OF BIRTH:  03-20-1936  DATE OF CONSULTATION:  10/20/2012  REFERRING PHYSICIAN:   CONSULTING PHYSICIAN:  Quetzaly Ebner B. Caspian Deleonardis, MD  ADDENDUM:  SUICIDE RISK ASSESSMENT: This is a patient with no past psychiatric history but now depressed after losing his wife, possibly with some cognitive decline. He is in no shape to plan or execute a suicide attempt but requires support and supervision.   DIAGNOSES:  AXIS I: Normal grief, cognitive disorder, not otherwise specified.  AXIS II: Deferred.  AXIS III: Psoriasis, anemia.  AXIS IV: Major loss, loss of way of life, inability to care for himself.  AXIS V: GAF on admission 25.   PLAN: I will start Remeron for depression, anxiety, insomnia and to increase appetite, 15 mg at night. I will follow along.     ____________________________ Ellin GoodieJolanta B. Jennet MaduroPucilowska, MD jbp:vtd D: 10/20/2012 15:21:08 ET T: 10/20/2012 16:09:21 ET JOB#: [id number]  cc: Ainslie Mazurek B. Jennet MaduroPucilowska, MD, <Dictator> Shari ProwsJOLANTA B Lenix Kidd MD ELECTRONICALLY SIGNED 10/23/2012 17:33

## 2015-04-05 NOTE — Consult Note (Signed)
CC: heme positive stol and profound anemia.  After transfusions and hydration will plan to do EGD and then possible colonoscopy.  Will follow with you.  Rule out ulcer, cancer of stomach, occult colon malignancy.  Electronic Signatures: Scot JunElliott, Nagee Goates T (MD)  (Signed on 03-Nov-13 17:07)  Authored  Last Updated: 03-Nov-13 17:07 by Scot JunElliott, Khiree Bukhari T (MD)
